# Patient Record
Sex: Male | Born: 1985 | Race: Black or African American | Hispanic: No | Marital: Married | Smoking: Current every day smoker
Health system: Southern US, Community
[De-identification: ages and names within clinical notes are randomized; demographics above are authoritative.]

---

## 2019-02-07 ENCOUNTER — Other Ambulatory Visit: Payer: Self-pay

## 2019-02-07 ENCOUNTER — Encounter (HOSPITAL_COMMUNITY): Payer: Self-pay | Admitting: Emergency Medicine

## 2019-02-07 ENCOUNTER — Emergency Department (HOSPITAL_COMMUNITY)
Admission: EM | Admit: 2019-02-07 | Discharge: 2019-02-07 | Disposition: A | Discharge status: Home / Self Care | Payer: Medicaid Other | Attending: Emergency Medicine | Admitting: Emergency Medicine

## 2019-02-07 DIAGNOSIS — K029 Dental caries, unspecified: Secondary | ICD-10-CM | POA: Diagnosis not present

## 2019-02-07 DIAGNOSIS — K047 Periapical abscess without sinus: Secondary | ICD-10-CM | POA: Insufficient documentation

## 2019-02-07 DIAGNOSIS — K0889 Other specified disorders of teeth and supporting structures: Secondary | ICD-10-CM | POA: Diagnosis present

## 2019-02-07 DIAGNOSIS — F17210 Nicotine dependence, cigarettes, uncomplicated: Secondary | ICD-10-CM | POA: Diagnosis not present

## 2019-02-07 MED ORDER — CLINDAMYCIN HCL 150 MG PO CAPS
300.0000 mg | ORAL_CAPSULE | Freq: Once | ORAL | Status: AC
  Administered 2019-02-07: 04:00:00 300 mg via ORAL
  Filled 2019-02-07: qty 2

## 2019-02-07 MED ORDER — HYDROCODONE-ACETAMINOPHEN 5-325 MG PO TABS: 1.0000 | ORAL_TABLET | Freq: Four times a day (QID) | ORAL | 0 refills | Status: AC | PRN

## 2019-02-07 MED ORDER — OXYCODONE-ACETAMINOPHEN 5-325 MG PO TABS
2.0000 | ORAL_TABLET | Freq: Once | ORAL | Status: AC
  Administered 2019-02-07: 2 via ORAL
  Filled 2019-02-07: qty 2

## 2019-02-07 MED ORDER — CLINDAMYCIN HCL 300 MG PO CAPS: 300.0000 mg | ORAL_CAPSULE | Freq: Four times a day (QID) | ORAL | 0 refills | Status: AC

## 2019-02-07 NOTE — ED Provider Notes (Signed)
[facility] EMERGENCY DEPARTMENT Provider Note   CSN: [id number] Arrival date & time: 02/07/19  3893     History   Chief Complaint Chief Complaint  Patient presents with  . Dental Pain    HPI Earlin Sweeden is a 33 y.o. male.     Patient is a 33 year old male with no significant past medical history.  He presents today with complaints of pain to the left lower molar.  This started earlier this evening.  He denies any injury or trauma.  He does have "a hole in his tooth".  He denies any difficulty breathing or swallowing.  He denies any fevers or chills.  The history is provided by the patient.  Dental Pain Location:  Lower Lower teeth location:  18/LL 2nd molar Quality:  Throbbing Severity:  Severe Onset quality:  Sudden Timing:  Constant Progression:  Worsening Chronicity:  New Context: poor dentition   Context: not abscess     No past medical history on file.  There are no active problems to display for this patient.         Home Medications    Prior to Admission medications   Not on File    Family History No family history on file.  Social History Social History   Tobacco Use  . Smoking status: Current Every Day Smoker    Packs/day: 0.25    Years: 10.00    Pack years: 2.50  . Smokeless tobacco: Never Used  Substance Use Topics  . Alcohol use: Not on file  . Drug use: Not on file     Allergies   Patient has no known allergies.   Review of Systems Review of Systems  All other systems reviewed and are negative.    Physical Exam Updated Vital Signs BP (!) 131/94 (BP Location: Right Arm)   Pulse 69   Temp 98.4 F (36.9 C) (Oral)   Resp 18   Ht 5\' 10"  (1.778 m)   Wt 79.4 kg   SpO2 99%   BMI 25.11 kg/m   Physical Exam Vitals signs and nursing note reviewed.  Constitutional:      General: He is not in acute distress.    Appearance: Normal appearance. He is not ill-appearing, toxic-appearing or diaphoretic.   HENT:     Head: Normocephalic and atraumatic.     Mouth/Throat:     Mouth: Mucous membranes are moist.     Pharynx: No oropharyngeal exudate.     Comments: The left second molar has heavy decay.  There is surrounding gingival inflammation, but no obvious abscess. Neck:     Musculoskeletal: Normal range of motion and neck supple. No neck rigidity or muscular tenderness.  Pulmonary:     Effort: Pulmonary effort is normal.  Skin:    General: Skin is warm and dry.  Neurological:     Mental Status: He is alert and oriented to person, place, and time.      ED Treatments / Results  Labs (all labs ordered are listed, but only abnormal results are displayed) Labs Reviewed - No data to display  EKG None  Radiology No results found.  Procedures Procedures (including critical care time)  Medications Ordered in ED Medications  oxyCODONE-acetaminophen (PERCOCET/ROXICET) 5-325 MG per tablet 2 tablet (has no administration in time range)  clindamycin (CLEOCIN) capsule 300 mg (has no administration in time range)     Initial Impression / Assessment and Plan / ED Course  I have reviewed the triage vital  signs and the nursing notes.  Pertinent labs & imaging results that were available during my care of the patient were reviewed by me and considered in my medical decision making (see chart for details).  Patient with dental pain related to dental caries.  He will be given antibiotics and pain medication.  He is to follow-up with dentistry as an outpatient.  Final Clinical Impressions(s) / ED Diagnoses   Final diagnoses:  None    ED Discharge Orders    None       Veryl Speak, MD 02/07/19 [phone]

## 2019-02-07 NOTE — Discharge Instructions (Addendum)
Begin taking clindamycin as prescribed.  Hydrocodone as prescribed as needed for pain.  Follow-up with dentistry in the next few days.

## 2019-02-07 NOTE — ED Triage Notes (Addendum)
Pt presents to ED POV from home. PT c/o tooth pain that began at 0100 today. Pain is on left side. Pt states tooth on that side is cracked. VSS. Pt reports taking tylenol pm around 0100.

## 2019-02-07 NOTE — ED Notes (Signed)
Discharge instructions discussed with pt. Pt verbalized understanding. Pt stable and ambulatory. No signature pad available. 

## 2020-07-26 ENCOUNTER — Encounter (HOSPITAL_COMMUNITY): Payer: Self-pay

## 2020-07-26 ENCOUNTER — Emergency Department (HOSPITAL_COMMUNITY): Payer: Medicaid Other

## 2020-07-26 ENCOUNTER — Other Ambulatory Visit: Payer: Self-pay

## 2020-07-26 ENCOUNTER — Emergency Department (HOSPITAL_COMMUNITY)
Admission: EM | Admit: 2020-07-26 | Discharge: 2020-07-26 | Disposition: A | Discharge status: Home / Self Care | Payer: Medicaid Other | Attending: Emergency Medicine | Admitting: Emergency Medicine

## 2020-07-26 DIAGNOSIS — W450XXA Nail entering through skin, initial encounter: Secondary | ICD-10-CM | POA: Insufficient documentation

## 2020-07-26 DIAGNOSIS — S99921A Unspecified injury of right foot, initial encounter: Secondary | ICD-10-CM | POA: Diagnosis not present

## 2020-07-26 DIAGNOSIS — Z5321 Procedure and treatment not carried out due to patient leaving prior to being seen by health care provider: Secondary | ICD-10-CM | POA: Insufficient documentation

## 2020-07-26 DIAGNOSIS — S90851A Superficial foreign body, right foot, initial encounter: Secondary | ICD-10-CM | POA: Insufficient documentation

## 2020-07-26 DIAGNOSIS — Y990 Civilian activity done for income or pay: Secondary | ICD-10-CM | POA: Diagnosis not present

## 2020-07-26 MED ORDER — TETANUS-DIPHTH-ACELL PERTUSSIS 5-2.5-18.5 LF-MCG/0.5 IM SUSY: 0.5000 mL | PREFILLED_SYRINGE | Freq: Once | INTRAMUSCULAR | Status: DC

## 2020-07-26 NOTE — ED Triage Notes (Signed)
Patient reports he stepped on a nail today at work, was able to pull the nail out, R foot pain, unknown last tetanus

## 2020-07-26 NOTE — ED Notes (Signed)
Pt did not want to wait and left. 

## 2020-07-26 NOTE — ED Triage Notes (Signed)
Emergency Medicine Provider Triage Evaluation Note  Anthony Miranda , a 35 y.o. male  was evaluated in triage.  Pt complains of injury to right foot.  Patient reports that while at work he stepped on a nail.  Reports that the nail went through the sole of his shoe and embedded into his foot.  Patient was able to remove nail without difficulty.  Patient complains of pain to dorsal aspect of his foot or stepping on nail.  Is unsure when his last tetanus shot  Review of Systems  Positive: Right foot pain Negative: Numbness or tingling, weakness, color change  Physical Exam  BP 138/82 (BP Location: Right Arm)   Pulse 82   Temp 98.7 F (37.1 C) (Oral)   Resp (!) 21   Ht 5\' 10"  (1.778 m)   Wt 79.4 kg   SpO2 100%   BMI 25.11 kg/m  Gen:   Awake, no distress   HEENT:  Atraumatic  Resp:  Normal effort  Cardiac:  Normal rate  MSK:   Moves extremities without difficulty; patient has 2 mm wound to plantar aspect of right foot, +3 dorsalis pedis pulse, patient has full range of motion to digits of right foot, patient intact to all digits of right foot, cap refill intact to all digits of right foot, tenderness to dorsal aspect of right foot Neuro:  Speech clear   Medical Decision Making  Medically screening exam initiated at 8:05 PM.  Appropriate orders placed.  Shon Indelicato was informed that the remainder of the evaluation will be completed by another provider, this initial triage assessment does not replace that evaluation, and the importance of remaining in the ED until their evaluation is complete.  Clinical Impression   The patient appears stable so that the remainder of the work up may be completed by another provider.      Kizzie Ide, Haskel Schroeder 07/26/20 2008

## 2020-11-07 ENCOUNTER — Other Ambulatory Visit: Payer: Self-pay

## 2020-11-07 ENCOUNTER — Encounter (HOSPITAL_BASED_OUTPATIENT_CLINIC_OR_DEPARTMENT_OTHER): Payer: Self-pay

## 2020-11-07 ENCOUNTER — Emergency Department (HOSPITAL_BASED_OUTPATIENT_CLINIC_OR_DEPARTMENT_OTHER): Payer: Medicaid Other | Admitting: Radiology

## 2020-11-07 ENCOUNTER — Emergency Department (HOSPITAL_BASED_OUTPATIENT_CLINIC_OR_DEPARTMENT_OTHER)
Admission: EM | Admit: 2020-11-07 | Discharge: 2020-11-08 | Disposition: A | Discharge status: Home / Self Care | Payer: Medicaid Other | Attending: Emergency Medicine | Admitting: Emergency Medicine

## 2020-11-07 DIAGNOSIS — S39012A Strain of muscle, fascia and tendon of lower back, initial encounter: Secondary | ICD-10-CM | POA: Diagnosis not present

## 2020-11-07 DIAGNOSIS — F17210 Nicotine dependence, cigarettes, uncomplicated: Secondary | ICD-10-CM | POA: Insufficient documentation

## 2020-11-07 DIAGNOSIS — X500XXA Overexertion from strenuous movement or load, initial encounter: Secondary | ICD-10-CM | POA: Diagnosis not present

## 2020-11-07 DIAGNOSIS — M545 Low back pain, unspecified: Secondary | ICD-10-CM | POA: Diagnosis not present

## 2020-11-07 DIAGNOSIS — S3992XA Unspecified injury of lower back, initial encounter: Secondary | ICD-10-CM | POA: Diagnosis present

## 2020-11-07 DIAGNOSIS — Y990 Civilian activity done for income or pay: Secondary | ICD-10-CM | POA: Diagnosis not present

## 2020-11-07 MED ORDER — METHOCARBAMOL 500 MG PO TABS
500.0000 mg | ORAL_TABLET | Freq: Once | ORAL | Status: AC
  Administered 2020-11-07: 500 mg via ORAL
  Filled 2020-11-07: qty 1

## 2020-11-07 MED ORDER — PREDNISONE 50 MG PO TABS
60.0000 mg | ORAL_TABLET | Freq: Once | ORAL | Status: AC
  Administered 2020-11-07: 60 mg via ORAL
  Filled 2020-11-07: qty 1

## 2020-11-07 NOTE — ED Triage Notes (Signed)
Patient here POV from Home with lower Back Pain.  Patient states Pain has been present and worsening for 2 days PTA. 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  Ambulatory, GCS 15. Ibuprofen 1 Hour PTA.

## 2020-11-07 NOTE — ED Provider Notes (Signed)
[facility] EMERGENCY DEPT Provider Note   CSN: [id number] Arrival date & time: 11/07/20  2308     History No chief complaint on file.   Anthony Miranda is a 35 y.o. male.  Patient with no significant past medical history presenting with low back pain for the past 2 days but is bilateral but worse on the right side.  Radiates to the buttocks bilaterally.  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  Pain became worse after work 2 days ago.  He is taken ibuprofen without relief.  There is no associated weakness, numbness, tingling.  No bowel or bladder incontinence.  No fever or vomiting.  No history of IV drug abuse or cancer.  No history of chronic back problems.  No previous back surgeries.  No pain with urination or blood in the urine.  No abdominal pain.  No testicular pain. States pain became worse tonight and not improved with ibuprofen at home  The history is provided by the patient.      No past medical history on file.  There are no problems to display for this patient.   No past surgical history on file.     No family history on file.  Social History   Tobacco Use   Smoking status: Every Day    Packs/day: 0.25    Years: 10.00    Pack years: 2.50    Types: Cigarettes   Smokeless tobacco: Never    Home Medications Prior to Admission medications   Medication Sig Start Date End Date Taking? Authorizing Provider  clindamycin (CLEOCIN) 300 MG capsule Take 1 capsule (300 mg total) by mouth 4 (four) times daily. X 7 days 02/07/19   Geoffery Lyons, MD  HYDROcodone-acetaminophen (NORCO) 5-325 MG tablet Take 1-2 tablets by mouth every 6 (six) hours as needed. 02/07/19   Geoffery Lyons, MD    Allergies    Patient has no known allergies.  Review of Systems   Review of Systems  Constitutional:  Negative for activity change, appetite change and fever.  HENT:  Negative for congestion and rhinorrhea.   Respiratory:   Negative for cough, chest tightness and shortness of breath.   Cardiovascular:  Negative for chest pain.  Gastrointestinal:  Negative for abdominal pain, nausea and vomiting.  Genitourinary:  Negative for dysuria and hematuria.  Musculoskeletal:  Positive for back pain and myalgias. Negative for arthralgias.  Skin:  Negative for rash.  Neurological:  Negative for dizziness, weakness and headaches.   all other systems are negative except as noted in the HPI and PMH.   Physical Exam Updated Vital Signs BP (!) 145/86 (BP Location: Right Arm)   Pulse 72   Temp 98.4 F (36.9 C) (Oral)   Resp 16   Ht 5\' 10"  (1.778 m)   Wt 79.4 kg   SpO2 100%   BMI 25.12 kg/m   Physical Exam Vitals and nursing note reviewed.  Constitutional:      General: He is not in acute distress.    Appearance: He is well-developed.  HENT:     Head: Normocephalic and atraumatic.     Mouth/Throat:     Pharynx: No oropharyngeal exudate.  Eyes:     Conjunctiva/sclera: Conjunctivae normal.     Pupils: Pupils are equal, round, and reactive to light.  Neck:     Comments: No meningismus. Cardiovascular:     Rate and Rhythm: Normal rate and regular rhythm.  Heart sounds: Normal heart sounds. No murmur heard. Pulmonary:     Effort: Pulmonary effort is normal. No respiratory distress.     Breath sounds: Normal breath sounds.  Abdominal:     Palpations: Abdomen is soft.     Tenderness: There is no abdominal tenderness. There is no guarding or rebound.  Musculoskeletal:        General: Tenderness present. Normal range of motion.     Cervical back: Normal range of motion and neck supple.     Comments: Right paraspinal lumbar tenderness,  5/5 strength in bilateral lower extremities. Ankle plantar and dorsiflexion intact. Great toe extension intact bilaterally. +2 DP and PT pulses. +2 patellar reflexes bilaterally. Normal gait.   Skin:    General: Skin is warm.  Neurological:     Mental Status: He is alert  and oriented to person, place, and time.     Cranial Nerves: No cranial nerve deficit.     Motor: No abnormal muscle tone.     Coordination: Coordination normal.     Comments:  5/5 strength throughout. CN 2-12 intact.Equal grip strength.   Psychiatric:        Behavior: Behavior normal.    ED Results / Procedures / Treatments   Labs (all labs ordered are listed, but only abnormal results are displayed) Labs Reviewed - No data to display  EKG None  Radiology DG Lumbar Spine Complete  Result Date: 11/07/2020 CLINICAL DATA:  Back pain EXAM: LUMBAR SPINE - COMPLETE 4+ VIEW COMPARISON:  None. FINDINGS: There is no evidence of lumbar spine fracture. Alignment is normal. Intervertebral disc spaces are maintained. IMPRESSION: Negative. Electronically Signed   By: Jasmine Pang M.D.   On: 11/07/2020 23:46    Procedures Procedures   Medications Ordered in ED Medications - No data to display  ED Course  I have reviewed the triage vital signs and the nursing notes.  Pertinent labs & imaging results that were available during my care of the patient were reviewed by me and considered in my medical decision making (see chart for details).    MDM Rules/Calculators/A&P                           2 days of atraumatic low back pain.  It is worse with lifting boxes at work but no specific injury.  Neurovascularly intact.  Equal strength, sensation, pulses and reflexes bilaterally.  Low suspicion for cord compression, cauda equina, epidural abscess.  Suspect likely musculoskeletal back strain or sprain.  X-rays negative.  We will treat supportively with anti-inflammatories and muscle relaxers. Follow-up with PCP.  Return precautions given.  Return precautions discussed. Final Clinical Impression(s) / ED Diagnoses Final diagnoses:  Strain of lumbar region, initial encounter    Rx / DC Orders ED Discharge Orders     None        Angelika Jerrett, Jeannett Senior, MD 11/08/20 [phone]

## 2020-11-07 NOTE — ED Notes (Signed)
Patient transported to X-ray 

## 2020-11-08 MED ORDER — METHOCARBAMOL 500 MG PO TABS: 500.0000 mg | ORAL_TABLET | Freq: Three times a day (TID) | ORAL | 0 refills | Status: AC | PRN

## 2020-11-08 MED ORDER — PREDNISONE 50 MG PO TABS: ORAL_TABLET | ORAL | 0 refills | Status: AC

## 2020-11-08 NOTE — Discharge Instructions (Addendum)
Take anti-inflammatories as prescribed.  Follow-up with your doctor.  Return to the ED if worsening pain, weakness, numbness, tingling, bowel bladder incontinence or any other concerns.

## 2020-11-11 ENCOUNTER — Telehealth: Payer: Self-pay

## 2020-11-11 NOTE — Telephone Encounter (Signed)
Transition Care Management Unsuccessful Follow-up Telephone Call  Date of discharge and from where:  11/08/2020 from [facility]  Attempts:  1st Attempt  Reason for unsuccessful TCM follow-up call:  Left voice message

## 2020-11-12 NOTE — Telephone Encounter (Signed)
Transition Care Management Unsuccessful Follow-up Telephone Call  Date of discharge and from where:  11/08/2020 from [facility]  Attempts:  2nd Attempt  Reason for unsuccessful TCM follow-up call:  Left voice message    

## 2020-11-13 NOTE — Telephone Encounter (Signed)
Transition Care Management Unsuccessful Follow-up Telephone Call  Date of discharge and from where:  11/08/2020 [facility]  Attempts:  3rd Attempt  Reason for unsuccessful TCM follow-up call:  Unable to reach patient

## 2021-03-23 ENCOUNTER — Encounter: Payer: Self-pay | Admitting: *Deleted

## 2021-03-23 ENCOUNTER — Ambulatory Visit
Admission: EM | Admit: 2021-03-23 | Discharge: 2021-03-23 | Disposition: A | Discharge status: Home / Self Care | Payer: Medicaid Other | Attending: Internal Medicine | Admitting: Internal Medicine

## 2021-03-23 ENCOUNTER — Other Ambulatory Visit: Payer: Self-pay

## 2021-03-23 DIAGNOSIS — J069 Acute upper respiratory infection, unspecified: Secondary | ICD-10-CM | POA: Diagnosis not present

## 2021-03-23 MED ORDER — PROMETHAZINE-DM 6.25-15 MG/5ML PO SYRP: 5.0000 mL | ORAL_SOLUTION | Freq: Four times a day (QID) | ORAL | 0 refills | Status: AC | PRN

## 2021-03-23 MED ORDER — PROMETHAZINE-DM 6.25-15 MG/5ML PO SYRP: 5.0000 mL | ORAL_SOLUTION | Freq: Four times a day (QID) | ORAL | 0 refills | Status: DC | PRN

## 2021-03-23 MED ORDER — FLUTICASONE PROPIONATE 50 MCG/ACT NA SUSP: 1.0000 | Freq: Every day | NASAL | 0 refills | Status: DC

## 2021-03-23 MED ORDER — FLUTICASONE PROPIONATE 50 MCG/ACT NA SUSP: 1.0000 | Freq: Every day | NASAL | 0 refills | Status: AC

## 2021-03-23 NOTE — ED Triage Notes (Signed)
Pt reports a on going cough.

## 2021-03-23 NOTE — ED Provider Notes (Signed)
[facility]    CSN: [id number] Arrival date & time: 03/23/21  0911      History   Chief Complaint Chief Complaint  Patient presents with   Cough    HPI Anthony Miranda is a 35 y.o. male.   Patient presents with nasal congestion, runny nose, nonproductive cough that started approximately 1 week ago.  Patient denies any known fevers but reports he did "feel feverish".  Patient's daughter and coworkers have had similar symptoms recently.  Patient has taken over-the-counter cough and cold medication with minimal improvement in symptoms.  Denies chest pain, shortness of breath, sore throat, ear pain, nausea, vomiting, diarrhea, abdominal pain.   Cough  History reviewed. No pertinent past medical history.  There are no problems to display for this patient.   History reviewed. No pertinent surgical history.     Home Medications    Prior to Admission medications   Medication Sig Start Date End Date Taking? Authorizing Provider  fluticasone (FLONASE) 50 MCG/ACT nasal spray Place 1 spray into both nostrils daily for 3 days. 03/23/21 03/26/21 Yes Briget Shaheed, Acie Fredrickson, FNP  promethazine-dextromethorphan (PROMETHAZINE-DM) 6.25-15 MG/5ML syrup Take 5 mLs by mouth 4 (four) times daily as needed for cough. 03/23/21  Yes Rhen Dossantos, Rolly Salter E, FNP  clindamycin (CLEOCIN) 300 MG capsule Take 1 capsule (300 mg total) by mouth 4 (four) times daily. X 7 days 02/07/19   Geoffery Lyons, MD  HYDROcodone-acetaminophen (NORCO) 5-325 MG tablet Take 1-2 tablets by mouth every 6 (six) hours as needed. 02/07/19   Geoffery Lyons, MD  methocarbamol (ROBAXIN) 500 MG tablet Take 1 tablet (500 mg total) by mouth every 8 (eight) hours as needed for muscle spasms. 11/08/20   Rancour, Jeannett Senior, MD  predniSONE (DELTASONE) 50 MG tablet 1 tablet PO daily 11/08/20   Glynn Octave, MD    Family History History reviewed. No pertinent family history.  Social History Social History   Tobacco Use   Smoking status:  Every Day    Packs/day: 0.25    Years: 10.00    Pack years: 2.50    Types: Cigarettes   Smokeless tobacco: Never     Allergies   Patient has no known allergies.   Review of Systems Review of Systems Per HPI  Physical Exam Triage Vital Signs ED Triage Vitals  Enc Vitals Group     BP 03/23/21 1002 121/82     Pulse Rate 03/23/21 1002 90     Resp 03/23/21 1002 18     Temp 03/23/21 1002 98.5 F (36.9 C)     Temp src --      SpO2 03/23/21 1002 97 %     Weight --      Height --      Head Circumference --      Peak Flow --      Pain Score 03/23/21 1003 0     Pain Loc --      Pain Edu? --      Excl. in GC? --    No data found.  Updated Vital Signs BP 121/82    Pulse 90    Temp 98.5 F (36.9 C)    Resp 18    SpO2 97%   Visual Acuity Right Eye Distance:   Left Eye Distance:   Bilateral Distance:    Right Eye Near:   Left Eye Near:    Bilateral Near:     Physical Exam Constitutional:      General: He is not in  acute distress.    Appearance: Normal appearance. He is not toxic-appearing or diaphoretic.  HENT:     Head: Normocephalic and atraumatic.     Right Ear: Tympanic membrane and ear canal normal.     Left Ear: Tympanic membrane and ear canal normal.     Nose: Congestion present.     Mouth/Throat:     Mouth: Mucous membranes are moist.     Pharynx: No posterior oropharyngeal erythema.  Eyes:     Extraocular Movements: Extraocular movements intact.     Conjunctiva/sclera: Conjunctivae normal.     Pupils: Pupils are equal, round, and reactive to light.  Cardiovascular:     Rate and Rhythm: Normal rate and regular rhythm.     Pulses: Normal pulses.     Heart sounds: Normal heart sounds.  Pulmonary:     Effort: Pulmonary effort is normal. No respiratory distress.     Breath sounds: Normal breath sounds. No stridor. No wheezing, rhonchi or rales.  Abdominal:     General: Abdomen is flat. Bowel sounds are normal.     Palpations: Abdomen is soft.   Musculoskeletal:        General: Normal range of motion.     Cervical back: Normal range of motion.  Skin:    General: Skin is warm and dry.  Neurological:     General: No focal deficit present.     Mental Status: He is alert and oriented to person, place, and time. Mental status is at baseline.  Psychiatric:        Mood and Affect: Mood normal.        Behavior: Behavior normal.     UC Treatments / Results  Labs (all labs ordered are listed, but only abnormal results are displayed) Labs Reviewed  COVID-19, FLU A+B AND RSV    EKG   Radiology No results found.  Procedures Procedures (including critical care time)  Medications Ordered in UC Medications - No data to display  Initial Impression / Assessment and Plan / UC Course  I have reviewed the triage vital signs and the nursing notes.  Pertinent labs & imaging results that were available during my care of the patient were reviewed by me and considered in my medical decision making (see chart for details).     Patient presents with symptoms likely from a viral upper respiratory infection. Differential includes bacterial pneumonia, sinusitis, allergic rhinitis, COVID, flu. Do not suspect underlying cardiopulmonary process. Symptoms seem unlikely related to ACS, CHF or COPD exacerbations, pneumonia, pneumothorax. Patient is nontoxic appearing and not in need of emergent medical intervention.  Recommended symptom control with over the counter medications. Patient offered prescriptions.  Advised patient cough medication can cause drowsiness.  Do not think that chest imaging is necessary given that there are no adventitious lung sounds on exam and no signs of respiratory compromise.  Return if symptoms fail to improve in 1-2 weeks or you develop shortness of breath, chest pain, severe headache. Patient states understanding and is agreeable.  Discharged with PCP followup.  Final Clinical Impressions(s) / UC Diagnoses    Final diagnoses:  Viral upper respiratory tract infection with cough     Discharge Instructions      It appears that you have a viral upper respiratory infection that should resolve in the next few days with symptomatic treatment.  You have been prescribed 2 medications to help alleviate your symptoms.  Please be advised that cough medication can cause drowsiness.  COVID-19 and flu  test is pending.  We will call if it is positive.    ED Prescriptions     Medication Sig Dispense Auth. Provider   promethazine-dextromethorphan (PROMETHAZINE-DM) 6.25-15 MG/5ML syrup Take 5 mLs by mouth 4 (four) times daily as needed for cough. 118 mL Ogle Hoeffner, Rolly Salter E, FNP   fluticasone (FLONASE) 50 MCG/ACT nasal spray Place 1 spray into both nostrils daily for 3 days. 16 g Gustavus Bryant, [place]      PDMP not reviewed this encounter.   Gustavus Bryant, [place] 03/23/21 1027

## 2021-03-23 NOTE — Discharge Instructions (Signed)
It appears that you have a viral upper respiratory infection that should resolve in the next few days with symptomatic treatment.  You have been prescribed 2 medications to help alleviate your symptoms.  Please be advised that cough medication can cause drowsiness.  COVID-19 and flu test is pending.  We will call if it is positive.

## 2021-03-24 LAB — COVID-19, FLU A+B AND RSV
Influenza A, NAA: NOT DETECTED
Influenza B, NAA: NOT DETECTED
RSV, NAA: NOT DETECTED
SARS-CoV-2, NAA: NOT DETECTED

## 2022-05-10 IMAGING — DX DG LUMBAR SPINE COMPLETE 4+V
5 series · 5 of 5 positions shown · non-contrast
Comparison: None.

CLINICAL DATA: Back pain

EXAM:
LUMBAR SPINE - COMPLETE 4+ VIEW

[l-spine ap]
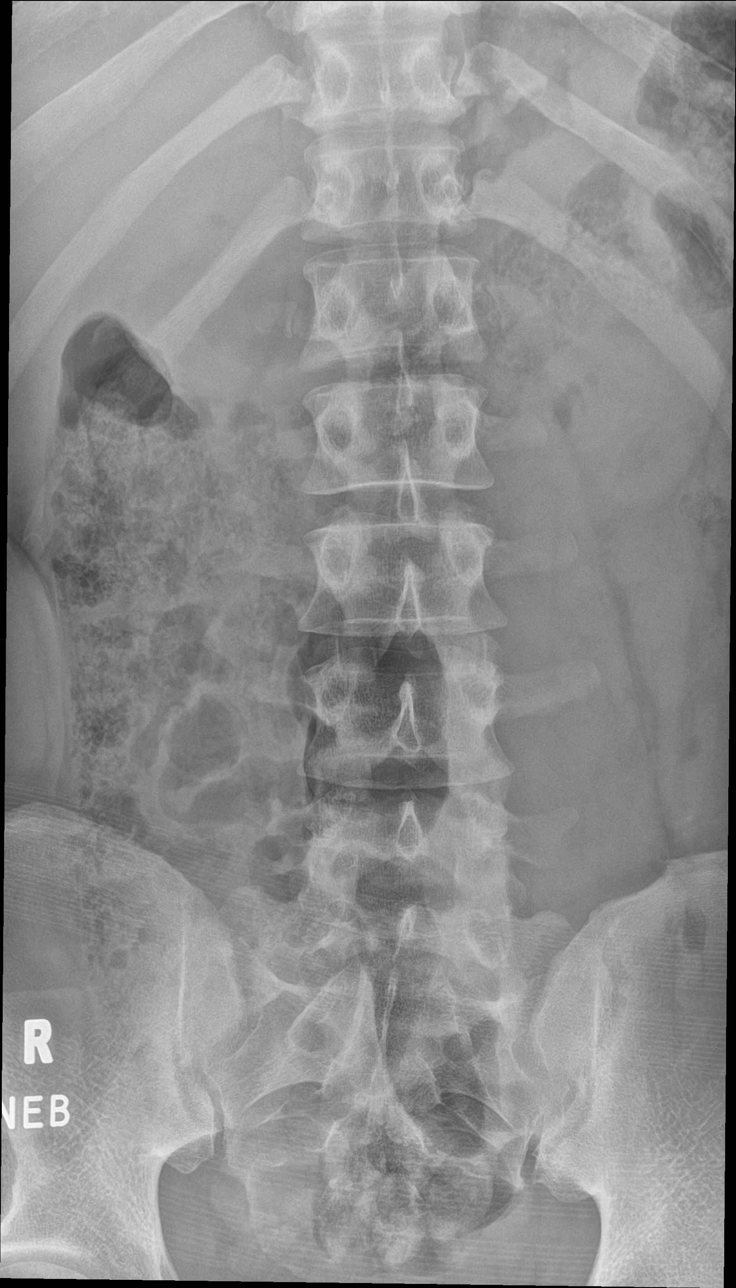

[l-spine obl (1 of 2)]
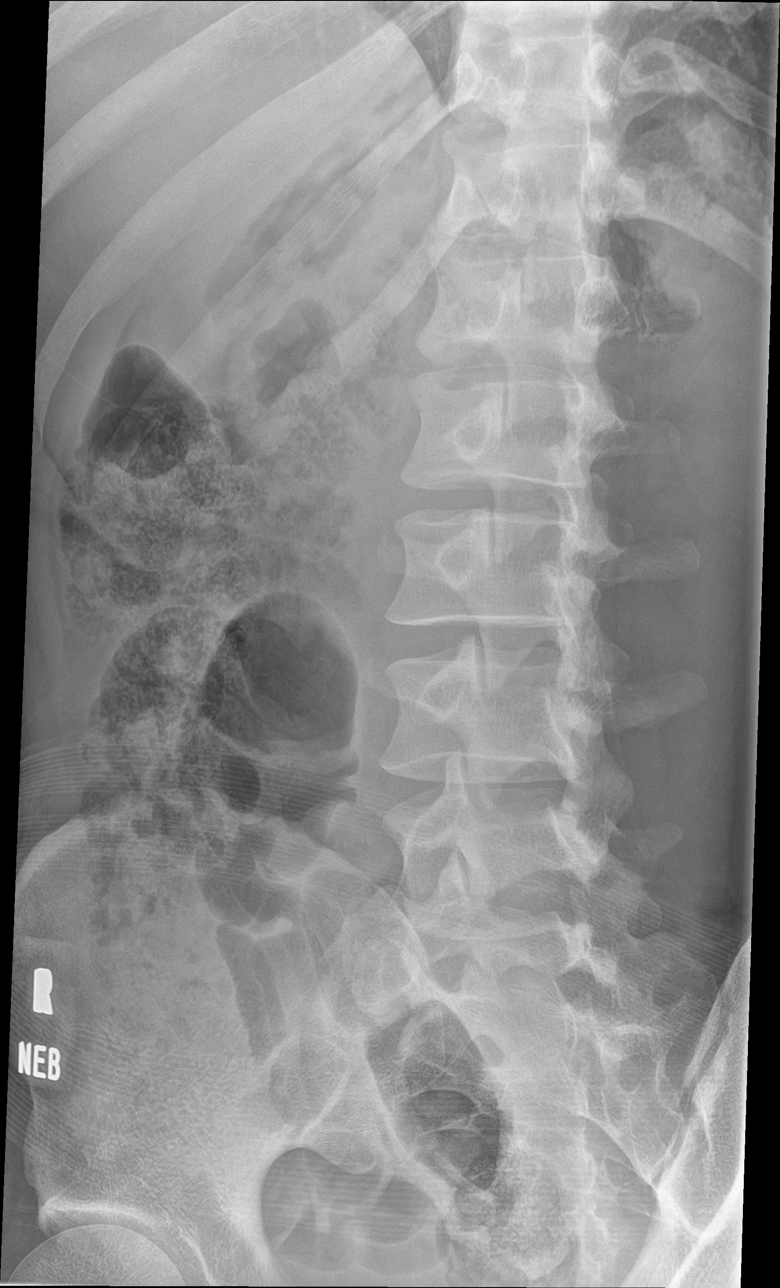

[l-spine obl (2 of 2)]
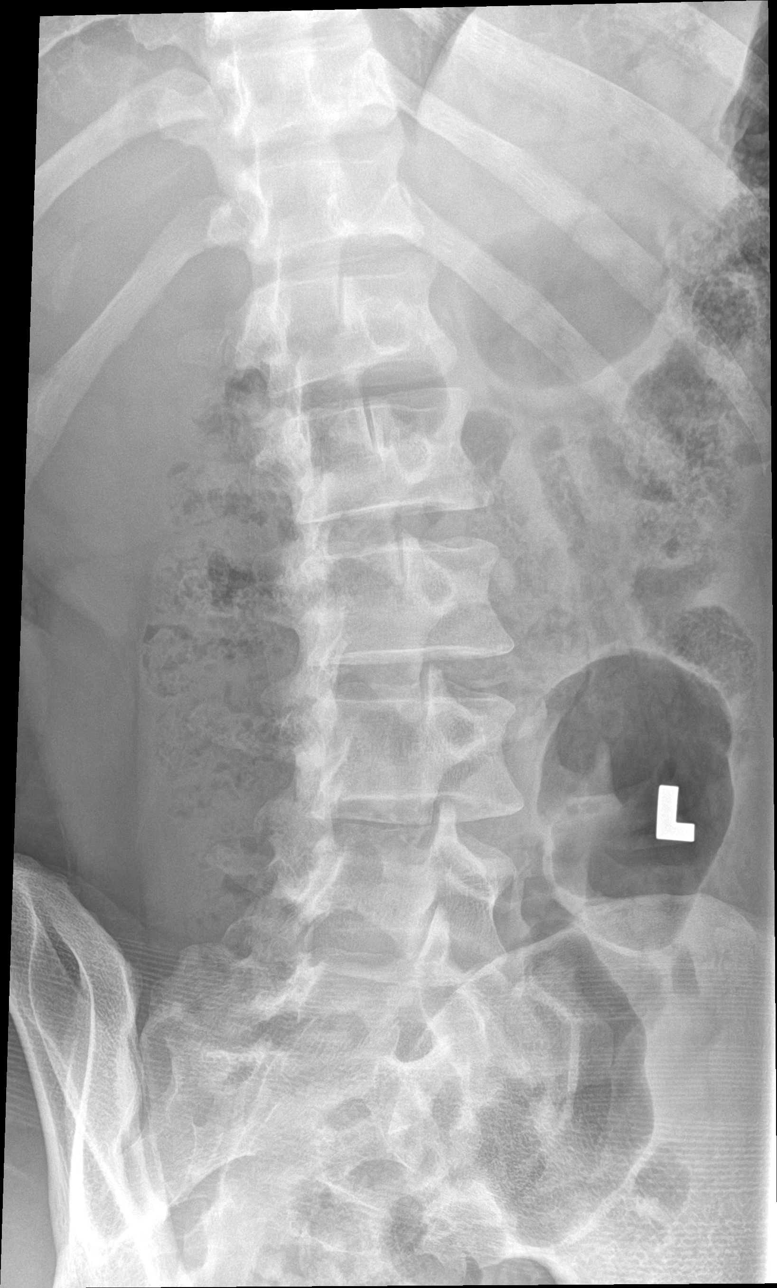

[l-spine lat]
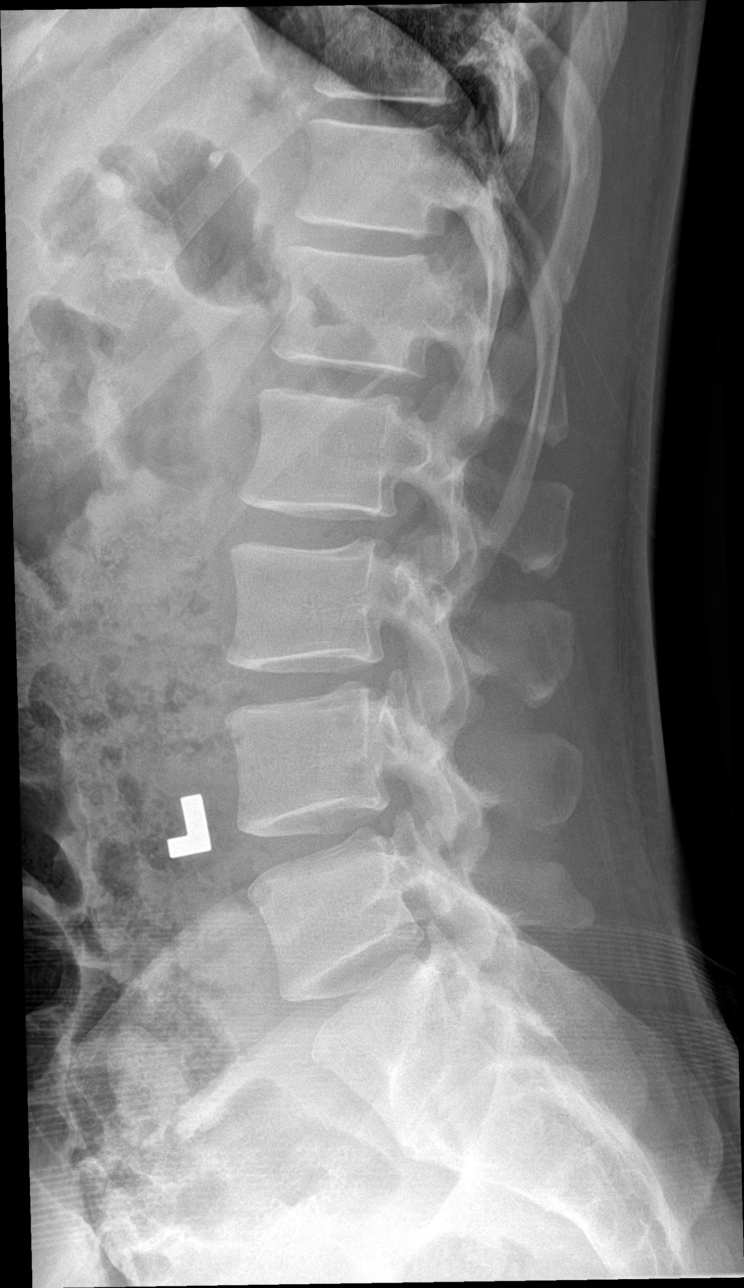

[l-spine spot]
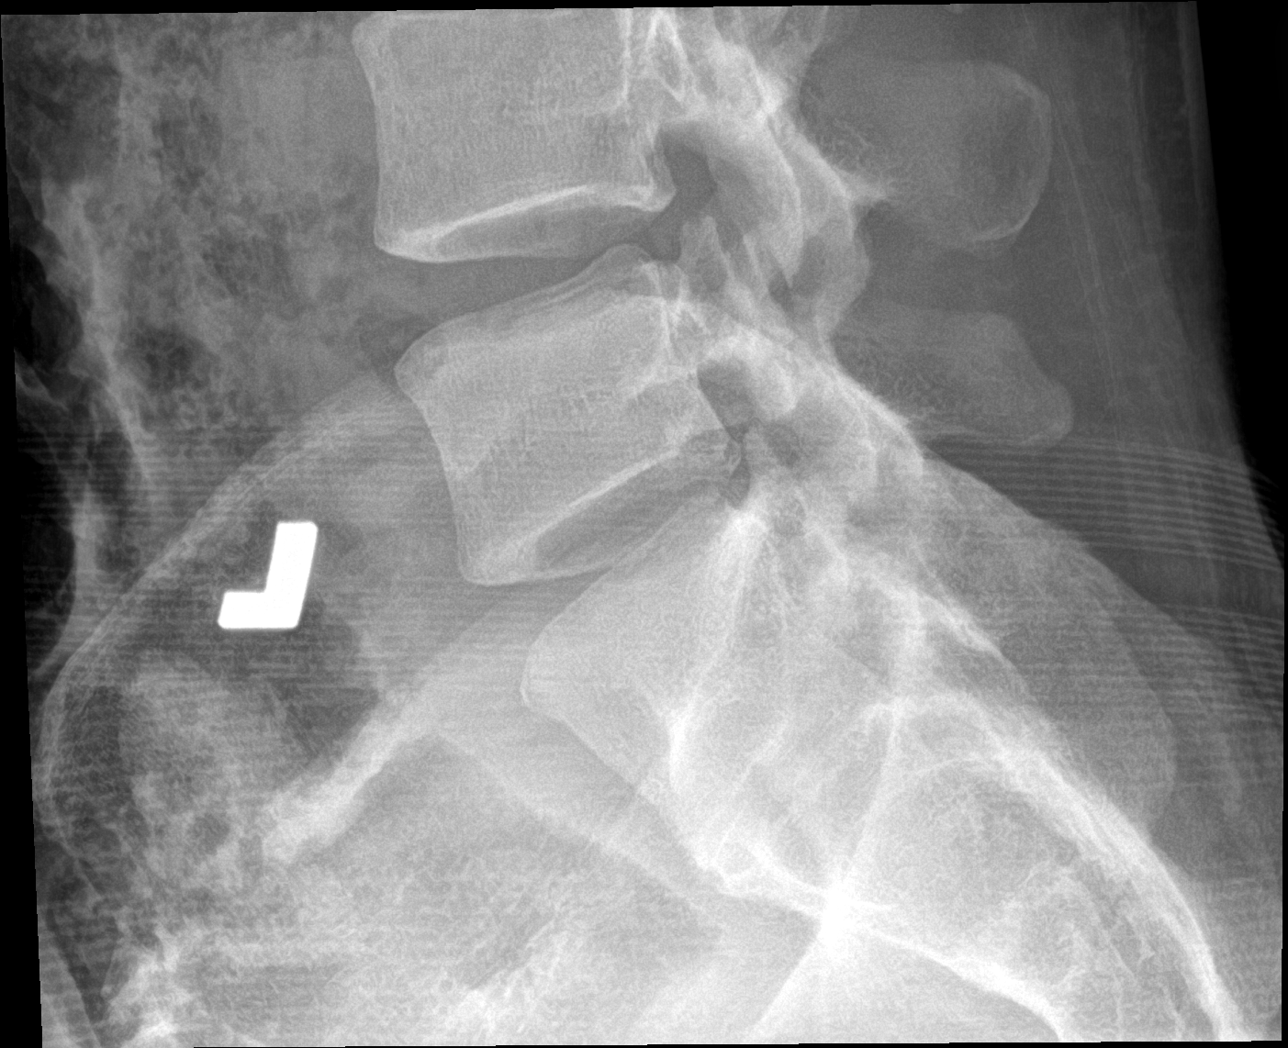

[5 of 5 positions shown; findings below may reference images not displayed]

FINDINGS: There is no evidence of lumbar spine fracture. Alignment is normal.
Intervertebral disc spaces are maintained.
IMPRESSION: Negative.

## 2022-08-03 ENCOUNTER — Telehealth: Payer: Self-pay

## 2022-08-03 NOTE — Telephone Encounter (Signed)
LVM for patient to call back. AS, CMA
